# Patient Record
Sex: Female | Born: 1955 | Race: White | Hispanic: No | Marital: Married | Smoking: Never smoker
Health system: Southern US, Community
[De-identification: ages and names within clinical notes are randomized; demographics above are authoritative.]

## PROBLEM LIST (undated history)

## (undated) DIAGNOSIS — K219 Gastro-esophageal reflux disease without esophagitis: Secondary | ICD-10-CM

## (undated) DIAGNOSIS — G709 Myoneural disorder, unspecified: Secondary | ICD-10-CM

## (undated) DIAGNOSIS — T7840XA Allergy, unspecified, initial encounter: Secondary | ICD-10-CM

## (undated) DIAGNOSIS — J45909 Unspecified asthma, uncomplicated: Secondary | ICD-10-CM

## (undated) DIAGNOSIS — M1990 Unspecified osteoarthritis, unspecified site: Secondary | ICD-10-CM

## (undated) DIAGNOSIS — E785 Hyperlipidemia, unspecified: Secondary | ICD-10-CM

## (undated) HISTORY — DX: Allergy, unspecified, initial encounter: T78.40XA

## (undated) HISTORY — DX: Unspecified osteoarthritis, unspecified site: M19.90

## (undated) HISTORY — DX: Myoneural disorder, unspecified: G70.9

## (undated) HISTORY — PX: CHOLECYSTECTOMY: SHX55

## (undated) HISTORY — PX: ABDOMINAL HYSTERECTOMY: SHX81

## (undated) HISTORY — PX: TONSILLECTOMY: SUR1361

## (undated) HISTORY — DX: Hyperlipidemia, unspecified: E78.5

## (undated) HISTORY — DX: Unspecified asthma, uncomplicated: J45.909

## (undated) HISTORY — PX: WRIST SURGERY: SHX841

## (undated) HISTORY — DX: Gastro-esophageal reflux disease without esophagitis: K21.9

---

## 2010-05-16 ENCOUNTER — Emergency Department (HOSPITAL_COMMUNITY)
Admission: EM | Admit: 2010-05-16 | Discharge: 2010-05-16 | Disposition: A | Discharge status: Home / Self Care | Payer: PRIVATE HEALTH INSURANCE | Attending: Emergency Medicine | Admitting: Emergency Medicine

## 2010-05-16 DIAGNOSIS — J45909 Unspecified asthma, uncomplicated: Secondary | ICD-10-CM | POA: Insufficient documentation

## 2010-05-16 DIAGNOSIS — M79609 Pain in unspecified limb: Secondary | ICD-10-CM | POA: Insufficient documentation

## 2010-05-16 DIAGNOSIS — Z79899 Other long term (current) drug therapy: Secondary | ICD-10-CM | POA: Insufficient documentation

## 2010-05-16 DIAGNOSIS — M7989 Other specified soft tissue disorders: Secondary | ICD-10-CM | POA: Insufficient documentation

## 2010-06-02 ENCOUNTER — Other Ambulatory Visit: Payer: Self-pay | Admitting: Family Medicine

## 2010-06-02 DIAGNOSIS — Z1231 Encounter for screening mammogram for malignant neoplasm of breast: Secondary | ICD-10-CM

## 2010-06-10 ENCOUNTER — Ambulatory Visit
Admission: RE | Admit: 2010-06-10 | Discharge: 2010-06-10 | Disposition: A | Discharge status: Home / Self Care | Payer: PRIVATE HEALTH INSURANCE | Source: Ambulatory Visit | Attending: Family Medicine | Admitting: Family Medicine

## 2010-06-10 DIAGNOSIS — Z1231 Encounter for screening mammogram for malignant neoplasm of breast: Secondary | ICD-10-CM

## 2011-05-17 ENCOUNTER — Other Ambulatory Visit: Payer: Self-pay | Admitting: Family Medicine

## 2011-05-17 ENCOUNTER — Other Ambulatory Visit: Payer: Self-pay | Admitting: Internal Medicine

## 2011-05-17 DIAGNOSIS — Z1231 Encounter for screening mammogram for malignant neoplasm of breast: Secondary | ICD-10-CM

## 2011-06-14 ENCOUNTER — Inpatient Hospital Stay: Admission: RE | Admit: 2011-06-14 | Payer: PRIVATE HEALTH INSURANCE | Source: Ambulatory Visit

## 2011-07-05 ENCOUNTER — Ambulatory Visit: Payer: PRIVATE HEALTH INSURANCE

## 2011-07-12 ENCOUNTER — Ambulatory Visit: Payer: PRIVATE HEALTH INSURANCE

## 2011-07-19 ENCOUNTER — Ambulatory Visit
Admission: RE | Admit: 2011-07-19 | Discharge: 2011-07-19 | Disposition: A | Discharge status: Home / Self Care | Payer: PRIVATE HEALTH INSURANCE | Source: Ambulatory Visit | Attending: Family Medicine | Admitting: Family Medicine

## 2012-10-16 ENCOUNTER — Other Ambulatory Visit: Payer: Self-pay

## 2012-10-16 DIAGNOSIS — Z1231 Encounter for screening mammogram for malignant neoplasm of breast: Secondary | ICD-10-CM

## 2012-11-02 ENCOUNTER — Ambulatory Visit
Admission: RE | Admit: 2012-11-02 | Discharge: 2012-11-02 | Disposition: A | Discharge status: Home / Self Care | Payer: BC Managed Care – PPO | Source: Ambulatory Visit

## 2012-11-02 DIAGNOSIS — Z1231 Encounter for screening mammogram for malignant neoplasm of breast: Secondary | ICD-10-CM

## 2013-11-13 ENCOUNTER — Other Ambulatory Visit: Payer: Self-pay | Admitting: Obstetrics & Gynecology

## 2013-11-13 DIAGNOSIS — Z1231 Encounter for screening mammogram for malignant neoplasm of breast: Secondary | ICD-10-CM

## 2013-11-21 ENCOUNTER — Other Ambulatory Visit: Payer: Self-pay | Admitting: Obstetrics & Gynecology

## 2013-11-21 DIAGNOSIS — Z1231 Encounter for screening mammogram for malignant neoplasm of breast: Secondary | ICD-10-CM

## 2013-11-29 ENCOUNTER — Ambulatory Visit
Admission: RE | Admit: 2013-11-29 | Discharge: 2013-11-29 | Disposition: A | Discharge status: Home / Self Care | Payer: BC Managed Care – PPO | Source: Ambulatory Visit | Attending: Obstetrics & Gynecology | Admitting: Obstetrics & Gynecology

## 2013-11-29 DIAGNOSIS — Z1231 Encounter for screening mammogram for malignant neoplasm of breast: Secondary | ICD-10-CM

## 2014-07-10 ENCOUNTER — Ambulatory Visit (INDEPENDENT_AMBULATORY_CARE_PROVIDER_SITE_OTHER): Payer: 59 | Admitting: Physical Therapy

## 2014-07-10 ENCOUNTER — Encounter: Payer: Self-pay | Admitting: Physical Therapy

## 2014-07-10 DIAGNOSIS — M25673 Stiffness of unspecified ankle, not elsewhere classified: Secondary | ICD-10-CM

## 2014-07-10 DIAGNOSIS — R531 Weakness: Secondary | ICD-10-CM | POA: Diagnosis not present

## 2014-07-10 DIAGNOSIS — M25571 Pain in right ankle and joints of right foot: Secondary | ICD-10-CM

## 2014-07-10 DIAGNOSIS — R269 Unspecified abnormalities of gait and mobility: Secondary | ICD-10-CM | POA: Diagnosis not present

## 2014-07-10 NOTE — Therapy (Addendum)
[place], [place], [address] Phone: [phone]   Fax:  [phone]  Physical Therapy Evaluation  Patient Details  Name: Betty Fuller MRN: [id number] Date of Birth: [birth year] Referring Provider:  Wylene Simmer, MD  Encounter Date: 07/10/2014      PT End of Session - 07/17/14 0936    Visit Number 2   Number of Visits 8   Date for PT Re-Evaluation 08/07/14   PT Start Time 0932   PT Stop Time 9191   PT Time Calculation (min) 55 min      Past Medical History  Diagnosis Date  . Allergy   . Arthritis   . Asthma   . GERD (gastroesophageal reflux disease)   . Hyperlipidemia   . Neuromuscular disorder     Past Surgical History  Procedure Laterality Date  . Wrist surgery Right     1998  . Cholecystectomy      2010  . Cesarean section    . Tonsillectomy    . Abdominal hysterectomy      There were no vitals filed for this visit.  Visit Diagnosis:  Stiffness of ankle joint, unspecified laterality - Plan: PT plan of care cert/re-cert  Pain in joint, ankle and foot, right - Plan: PT plan of care cert/re-cert  Weakness generalized - Plan: PT plan of care cert/re-cert  Abnormality of gait - Plan: PT plan of care cert/re-cert      Subjective Assessment - 07/17/14 0936    Subjective Pt reports her Rt ankle is still very point tender to touch.  Pt has been performing HEP.   At night, pain is worst.    Currently in Pain? Yes   Pain Score 7   worse at night    Pain Location Ankle   Pain Orientation Right   Pain Descriptors / Indicators Aching;Dull   Aggravating Factors  standing and walking, touching area   Pain Relieving Factors medication and sleep            [facility] PT Assessment - 07/17/14 0001    Assessment   Medical Diagnosis Rt peroneal tendon strain   Onset Date/Surgical Date 06/09/14   Next MD Visit 08/22/14   AROM   Right Ankle Dorsiflexion 14   Right Ankle Plantar  Flexion 42   Strength   Right Ankle Dorsiflexion 5/5   Right Ankle Inversion 5/5   Right Ankle Eversion --  5-/5, with pain                    [facility] Adult PT Treatment/Exercise - 07/17/14 0001    Exercises   Exercises Ankle   Modalities   Modalities Iontophoresis;Vasopneumatic   Iontophoresis   Type of Iontophoresis Dexamethasone   Location Rt lateral 5th met head   Dose 1.0cc   Time 6hr patch   Vasopneumatic   Number Minutes Vasopneumatic  15 minutes   Vasopnuematic Location  Ankle   Vasopneumatic Pressure Medium   Vasopneumatic Temperature  3*   Ankle Exercises: Stretches   Soleus Stretch 30 seconds;3 reps   Gastroc Stretch 30 seconds;3 reps   Ankle Exercises: Aerobic   Stationary Bike NuStep L4: 38mn    Ankle Exercises: Seated   Towel Crunch 3 reps   Towel Inversion/Eversion 3 reps   Heel Raises 5 reps  2 sets, painful    Toe Raise 10 reps   Ankle Exercises: Supine   T-Band Inversion, eversion, DF, PF x 10 reps x  2 sets with red band                 PT Education - 07/17/14 1029    Education provided Yes   Education Details HEP   Person(s) Educated Patient   Methods Explanation;Handout   Comprehension Verbalized understanding             PT Long Term Goals - 07/10/14 1104    PT LONG TERM GOAL #1   Title I with advanced HEP ( 08/07/14)   Time 4   Period Weeks   Status New   PT LONG TERM GOAL #2   Title increase bilat ankle dorsiflexion =/> 15 degrees to assist with normalizing gait ( 08/07/14)    Time 4   Period Weeks   Status New   PT LONG TERM GOAL #4   Title increase strenght bilat LE's =/> 5-/5 ( 08/07/14)   Time 4   Period Weeks   Status New   PT LONG TERM GOAL #5   Title report decrease pain in Rt ankle =/> 75% with daily activity ( 08/07/14)    Time 4   Period Weeks   Status New   Additional Long Term Goals   Additional Long Term Goals Yes   PT LONG TERM GOAL #6   Title improve FOTO =/< 36% limited   Time 4   Period  Weeks   Status New               Plan - 07/17/14 1001    Clinical Impression Statement Pt demo improved Rt ankle ROM and strength today.  Pt continues with increased pain and sensitivity in Rt lateral foot. Pt tolerated all exercises except Bilat heel raises due to pain.    Pt will benefit from skilled therapeutic intervention in order to improve on the following deficits Abnormal gait;Decreased range of motion;Difficulty walking;Pain;Decreased strength;Decreased balance   Rehab Potential Excellent   PT Frequency 2x / week   PT Duration 4 weeks   PT Treatment/Interventions Cryotherapy;Electrical Stimulation;Iontophoresis 8m/ml Dexamethasone;Gait training;Ultrasound;Moist Heat;Therapeutic exercise;Balance training;Neuromuscular re-education;Patient/family education   PT Next Visit Plan progress ankle and hip strengthening, continue ionto   Consulted and Agree with Plan of Care Patient         Problem List There are no active problems to display for this patient.   SJeral PinchPT 07/17/2014, 2:27 PM  [facility] [address]Phone: [phone]  Fax:  [phone]

## 2014-07-10 NOTE — Patient Instructions (Signed)
Ankle Alphabet  [place] [id number]   Using left ankle and foot only, trace the letters of the alphabet. Perform A to Z. Repeat _1___ times per set. Do _1___ sets per session. Do __2-3__ sessions per day   Ankle Circles   Slowly rotate right foot and ankle clockwise then counterclockwise. Gradually increase range of motion. Avoid pain. Circle __10__ times each direction per set. Do __1__ sets per session. Do 2-3____ sessions per day.  http://orth.exer.us/30   ROM: Plantar / Dorsiflexion   With left leg relaxed, gently flex and extend ankle. Move through full range of motion. Avoid pain. Repeat __20__ times per set. Do __1__ sets per session. Do __2-3__ sessions per day.  Achilles / Gastroc, Standing   Stand, right foot behind, heel on floor and turned slightly out, leg straight, forward leg bent. Move hips forward. Hold 30-45___ seconds. Repeat _1__ times per session. Do _2-3__ sessions per day.  Copyright  VHI. All rights reserved.   Start icing the side of your ankle for 10-15 min, twice a day.

## 2014-07-17 ENCOUNTER — Ambulatory Visit (INDEPENDENT_AMBULATORY_CARE_PROVIDER_SITE_OTHER): Payer: 59 | Admitting: Physical Therapy

## 2014-07-17 DIAGNOSIS — R269 Unspecified abnormalities of gait and mobility: Secondary | ICD-10-CM | POA: Diagnosis not present

## 2014-07-17 DIAGNOSIS — R531 Weakness: Secondary | ICD-10-CM

## 2014-07-17 DIAGNOSIS — M25673 Stiffness of unspecified ankle, not elsewhere classified: Secondary | ICD-10-CM

## 2014-07-17 DIAGNOSIS — M25571 Pain in right ankle and joints of right foot: Secondary | ICD-10-CM

## 2014-07-17 NOTE — Therapy (Addendum)
[place], [place], [address] Phone: [phone]   Fax:  [phone]  Physical Therapy Treatment  Patient Details  Name: Betty Fuller MRN: [id number] Date of Birth: 1955/11/03 Referring Provider:  Wylene Simmer, MD  Encounter Date: 07/17/2014      PT End of Session - 07/17/14 0936    Visit Number 2   Number of Visits 8   Date for PT Re-Evaluation 08/07/14   PT Start Time 0932   PT Stop Time 8099   PT Time Calculation (min) 55 min      Past Medical History  Diagnosis Date  . Allergy   . Arthritis   . Asthma   . GERD (gastroesophageal reflux disease)   . Hyperlipidemia   . Neuromuscular disorder     Past Surgical History  Procedure Laterality Date  . Wrist surgery Right     1998  . Cholecystectomy      2010  . Cesarean section    . Tonsillectomy    . Abdominal hysterectomy      There were no vitals filed for this visit.  Visit Diagnosis:  Stiffness of ankle joint, unspecified laterality  Pain in joint, ankle and foot, right  Weakness generalized  Abnormality of gait      Subjective Assessment - 07/17/14 0936    Subjective Pt reports her Rt ankle is still very point tender to touch.  Pt has been performing HEP.   At night, pain is worst.    Currently in Pain? Yes   Pain Score 7   worse at night    Pain Location Ankle   Pain Orientation Right   Pain Descriptors / Indicators Aching;Dull   Aggravating Factors  standing and walking, touching area   Pain Relieving Factors medication and sleep            [facility] PT Assessment - 07/17/14 0001    Assessment   Medical Diagnosis Rt peroneal tendon strain   Onset Date/Surgical Date 06/09/14   Next MD Visit 08/22/14   AROM   Right Ankle Dorsiflexion 14   Right Ankle Plantar Flexion 42   Strength   Right Ankle Dorsiflexion 5/5   Right Ankle Inversion 5/5   Right Ankle Eversion --  5-/5, with pain                       [facility] Adult PT Treatment/Exercise - 07/17/14 0001    Exercises   Exercises Ankle   Modalities   Modalities Iontophoresis;Vasopneumatic   Iontophoresis   Type of Iontophoresis Dexamethasone   Location Rt lateral 5th met head   Dose 1.0cc   Time 6hr patch   Vasopneumatic   Number Minutes Vasopneumatic  15 minutes   Vasopnuematic Location  Ankle   Vasopneumatic Pressure Medium   Vasopneumatic Temperature  3*   Ankle Exercises: Stretches   Soleus Stretch 30 seconds;3 reps   Gastroc Stretch 30 seconds;3 reps   Ankle Exercises: Aerobic   Stationary Bike NuStep L4: 69mn    Ankle Exercises: Seated   Towel Crunch 3 reps   Towel Inversion/Eversion 3 reps   Heel Raises 5 reps  2 sets, painful    Toe Raise 10 reps   Ankle Exercises: Supine   T-Band Inversion, eversion, DF, PF x 10 reps x 2 sets with red band                 PT Education - 07/17/14 1029  Education provided Yes   Education Details HEP   Person(s) Educated Patient   Methods Explanation;Handout   Comprehension Verbalized understanding             PT Long Term Goals - 07/10/14 1104    PT LONG TERM GOAL #1   Title I with advanced HEP ( 08/07/14)   Time 4   Period Weeks   Status New   PT LONG TERM GOAL #2   Title increase bilat ankle dorsiflexion =/> 15 degrees to assist with normalizing gait ( 08/07/14)    Time 4   Period Weeks   Status New   PT LONG TERM GOAL #4   Title increase strenght bilat LE's =/> 5-/5 ( 08/07/14)   Time 4   Period Weeks   Status New   PT LONG TERM GOAL #5   Title report decrease pain in Rt ankle =/> 75% with daily activity ( 08/07/14)    Time 4   Period Weeks   Status New   Additional Long Term Goals   Additional Long Term Goals Yes   PT LONG TERM GOAL #6   Title improve FOTO =/< 36% limited   Time 4   Period Weeks   Status New               Plan - 07/17/14 1001    Clinical Impression Statement Pt demo improved Rt ankle ROM  and strength today.  Pt continues with increased pain and sensitivity in Rt lateral foot. Pt tolerated all exercises except Bilat heel raises due to pain.    Pt will benefit from skilled therapeutic intervention in order to improve on the following deficits Abnormal gait;Decreased range of motion;Difficulty walking;Pain;Decreased strength;Decreased balance   Rehab Potential Excellent   PT Frequency 2x / week   PT Duration 4 weeks   PT Treatment/Interventions Cryotherapy;Electrical Stimulation;Iontophoresis 72m/ml Dexamethasone;Gait training;Ultrasound;Moist Heat;Therapeutic exercise;Balance training;Neuromuscular re-education;Patient/family education   PT Next Visit Plan progress ankle and hip strengthening, continue ionto   Consulted and Agree with Plan of Care Patient        Problem List There are no active problems to display for this patient.   JKerin Perna PTA 07/17/2014 10:44 AM  [facility] [address]Phone: [phone]  Fax:  [phone]

## 2014-07-17 NOTE — Patient Instructions (Addendum)
Inversion: Resisted   Cross legs with right leg underneath, foot in tubing loop. Hold tubing around other foot to resist and turn foot in. Repeat __10__ times per set. Do _2-3___ sets per session. Do _1___ sessions per day.   Eversion: Resisted   With right foot in tubing loop, hold tubing around other foot to resist and turn foot out. Repeat _10___ times per set. Do __2-3__ sets per session. Do __1__ sessions per day.     Dorsiflexion: Resisted   Facing anchor, tubing around left foot, pull toward face.  Repeat _10___ times per set. Do __2-3__ sets per session. Do _1___ sessions per day.   [facility] [address] [place], [place] [address]  [phone] (office) [phone] (fax)

## 2014-07-22 ENCOUNTER — Encounter: Payer: Self-pay | Admitting: Physical Therapy

## 2014-07-24 ENCOUNTER — Ambulatory Visit (INDEPENDENT_AMBULATORY_CARE_PROVIDER_SITE_OTHER): Payer: 59 | Admitting: Physical Therapy

## 2014-07-24 DIAGNOSIS — M25673 Stiffness of unspecified ankle, not elsewhere classified: Secondary | ICD-10-CM | POA: Diagnosis not present

## 2014-07-24 DIAGNOSIS — M25571 Pain in right ankle and joints of right foot: Secondary | ICD-10-CM | POA: Diagnosis not present

## 2014-07-24 DIAGNOSIS — R269 Unspecified abnormalities of gait and mobility: Secondary | ICD-10-CM | POA: Diagnosis not present

## 2014-07-24 DIAGNOSIS — R531 Weakness: Secondary | ICD-10-CM

## 2014-07-24 NOTE — Therapy (Addendum)
[facility], [place], [address] Phone: [phone]   Fax:  [phone]  Physical Therapy Treatment  Patient Details  Name: Betty Fuller MRN: [id number] Date of Birth: [birth year] Referring Provider:  Wylene Simmer, MD  Encounter Date: 07/24/2014      PT End of Session - 07/24/14 0942    Visit Number 3   Number of Visits 8   Date for PT Re-Evaluation 08/07/14   PT Start Time 0940   PT Stop Time 1026   PT Time Calculation (min) 46 min      Past Medical History  Diagnosis Date  . Allergy   . Arthritis   . Asthma   . GERD (gastroesophageal reflux disease)   . Hyperlipidemia   . Neuromuscular disorder     Past Surgical History  Procedure Laterality Date  . Wrist surgery Right     1998  . Cholecystectomy      2010  . Cesarean section    . Tonsillectomy    . Abdominal hysterectomy      There were no vitals filed for this visit.  Visit Diagnosis:  Stiffness of ankle joint, unspecified laterality  Pain in joint, ankle and foot, right  Weakness generalized  Abnormality of gait      Subjective Assessment - 07/24/14 0942    Subjective Pt reports the pain in Rt lateral foot has not changed any.  Reports pain is still worst at night.    Currently in Pain? Yes   Pain Score 7    Pain Location Foot   Pain Orientation Right   Pain Descriptors / Indicators Sharp  Nagging    Aggravating Factors  Standing, walking, touching area   Pain Relieving Factors medication and rest             [facility] PT Assessment - 07/24/14 0001    Assessment   Medical Diagnosis Rt peroneal tendon strain   Onset Date/Surgical Date 06/09/14   Next MD Visit 08/22/14                     [facility] Adult PT Treatment/Exercise - 07/24/14 0001    Modalities   Modalities Iontophoresis;Ultrasound   Ultrasound   Ultrasound Location Rt lateral ankle and 5th metatarsal head   Ultrasound Parameters 50%,  1.2 w/cm2, 3.3 mHz, 8 min    Ultrasound Goals Edema;Pain   Iontophoresis   Type of Iontophoresis Dexamethasone   Location Rt lateral 5th met head   Dose 1.0cc   Time 6hr patch   Manual Therapy   Manual Therapy Other (comment)   Other Manual Therapy Ice massage to Rt 5th met head x 3 min    Ankle Exercises: Seated   Towel Crunch 3 reps;Weights  2# on bath towel   Towel Inversion/Eversion 3 reps  2# wt on towel.   Other Seated Ankle Exercises Ankle eversion with red band x 10 reps x 3 sets    Ankle Exercises: Aerobic   Stationary Bike NuStep L4: 82mn    Ankle Exercises: Stretches   Soleus Stretch 30 seconds;3 reps   Gastroc Stretch 30 seconds;3 reps   Ankle Exercises: Standing   SLS 3 trials of up to 30 sec,  SLS on each leg    Heel Raises 5 reps  2 sets, BUE support   Toe Raise 10 reps                PT Education - 07/24/14 1328  Education provided Yes   Education Details self care- pt instructed to try ice massage at home.    Person(s) Educated Patient   Methods Handout   Comprehension Verbalized understanding;Returned demonstration             PT Long Term Goals - 07/10/14 1104    PT LONG TERM GOAL #1   Title I with advanced HEP ( 08/07/14)   Time 4   Period Weeks   Status New   PT LONG TERM GOAL #2   Title increase bilat ankle dorsiflexion =/> 15 degrees to assist with normalizing gait ( 08/07/14)    Time 4   Period Weeks   Status New   PT LONG TERM GOAL #4   Title increase strenght bilat LE's =/> 5-/5 ( 08/07/14)   Time 4   Period Weeks   Status New   PT LONG TERM GOAL #5   Title report decrease pain in Rt ankle =/> 75% with daily activity ( 08/07/14)    Time 4   Period Weeks   Status New   Additional Long Term Goals   Additional Long Term Goals Yes   PT LONG TERM GOAL #6   Title improve FOTO =/< 36% limited   Time 4   Period Weeks   Status New               Plan - 07/24/14 1407    Clinical Impression Statement Pt able to tolerate  ther ex a little better this session. Pt's pain remains unchanged from last session; area in Rt lateral foot continues to experience increased pain with SLS, B heel raises, and heel cord stretches on step.  Pt noted decrease in pain at end of session with use of [place] and ice massage.  Pt concerned with high co-pay; requests to continue therapy at 1x/wk instead of 2x.     Pt will benefit from skilled therapeutic intervention in order to improve on the following deficits Abnormal gait;Decreased range of motion;Difficulty walking;Pain;Decreased strength;Decreased balance   Rehab Potential Excellent   PT Frequency 2x / week   PT Duration 4 weeks   PT Treatment/Interventions Cryotherapy;Electrical Stimulation;Iontophoresis 41m/ml Dexamethasone;Gait training;Ultrasound;Moist Heat;Therapeutic exercise;Balance training;Neuromuscular re-education;Patient/family education   PT Next Visit Plan progress ankle and hip strengthening, continue ionto and [place]    Consulted and Agree with Plan of Care Patient        Problem List There are no active problems to display for this patient.   JKerin Perna PTA 07/24/2014 2:10 PM  [facility] [place] [address] [address]Phone: [phone]  Fax:  [phone]    PHYSICAL THERAPY DISCHARGE SUMMARY  Visits from Start of Care: 3  Current functional level related to goals / functional outcomes: See above   Remaining deficits: See above   Education / Equipment: HEP Plan: Patient agrees to discharge.  Patient goals were not met. Patient is being discharged due to financial reasons.  ?????   SJeral Pinch PT 08/13/2014 8:15 AM

## 2014-07-30 ENCOUNTER — Encounter: Payer: 59 | Admitting: Physical Therapy

## 2014-10-28 ENCOUNTER — Other Ambulatory Visit: Payer: Self-pay

## 2014-10-28 DIAGNOSIS — Z1231 Encounter for screening mammogram for malignant neoplasm of breast: Secondary | ICD-10-CM

## 2014-12-02 ENCOUNTER — Ambulatory Visit: Payer: 59

## 2015-01-02 ENCOUNTER — Ambulatory Visit
Admission: RE | Admit: 2015-01-02 | Discharge: 2015-01-02 | Disposition: A | Discharge status: Home / Self Care | Payer: Managed Care, Other (non HMO) | Source: Ambulatory Visit

## 2015-01-02 DIAGNOSIS — Z1231 Encounter for screening mammogram for malignant neoplasm of breast: Secondary | ICD-10-CM

## 2015-10-15 ENCOUNTER — Other Ambulatory Visit: Payer: Self-pay | Admitting: Obstetrics & Gynecology

## 2015-10-15 DIAGNOSIS — Z1231 Encounter for screening mammogram for malignant neoplasm of breast: Secondary | ICD-10-CM

## 2015-10-29 ENCOUNTER — Other Ambulatory Visit: Payer: Self-pay | Admitting: Family Medicine

## 2015-10-29 DIAGNOSIS — Z1231 Encounter for screening mammogram for malignant neoplasm of breast: Secondary | ICD-10-CM

## 2016-01-04 ENCOUNTER — Encounter: Payer: Self-pay | Admitting: Radiology

## 2016-01-04 ENCOUNTER — Ambulatory Visit
Admission: RE | Admit: 2016-01-04 | Discharge: 2016-01-04 | Disposition: A | Discharge status: Home / Self Care | Payer: 59 | Source: Ambulatory Visit | Attending: Family Medicine | Admitting: Family Medicine

## 2016-01-04 DIAGNOSIS — Z1231 Encounter for screening mammogram for malignant neoplasm of breast: Secondary | ICD-10-CM

## 2016-12-05 ENCOUNTER — Other Ambulatory Visit: Payer: Self-pay | Admitting: Family Medicine

## 2016-12-05 DIAGNOSIS — Z1231 Encounter for screening mammogram for malignant neoplasm of breast: Secondary | ICD-10-CM

## 2017-01-06 ENCOUNTER — Ambulatory Visit
Admission: RE | Admit: 2017-01-06 | Discharge: 2017-01-06 | Disposition: A | Discharge status: Home / Self Care | Payer: 59 | Source: Ambulatory Visit | Attending: Family Medicine | Admitting: Family Medicine

## 2017-01-06 DIAGNOSIS — Z1231 Encounter for screening mammogram for malignant neoplasm of breast: Secondary | ICD-10-CM

## 2017-12-14 ENCOUNTER — Other Ambulatory Visit: Payer: Self-pay | Admitting: Family Medicine

## 2017-12-14 DIAGNOSIS — Z1231 Encounter for screening mammogram for malignant neoplasm of breast: Secondary | ICD-10-CM

## 2018-01-26 ENCOUNTER — Ambulatory Visit: Payer: 59

## 2018-02-27 ENCOUNTER — Ambulatory Visit: Payer: Self-pay

## 2018-03-08 ENCOUNTER — Emergency Department (INDEPENDENT_AMBULATORY_CARE_PROVIDER_SITE_OTHER)
Admission: EM | Admit: 2018-03-08 | Discharge: 2018-03-08 | Disposition: A | Discharge status: Home / Self Care | Payer: Commercial Managed Care - PPO | Source: Home / Self Care | Attending: Family Medicine | Admitting: Family Medicine

## 2018-03-08 ENCOUNTER — Encounter: Payer: Self-pay | Admitting: Emergency Medicine

## 2018-03-08 ENCOUNTER — Other Ambulatory Visit: Payer: Self-pay

## 2018-03-08 ENCOUNTER — Emergency Department (INDEPENDENT_AMBULATORY_CARE_PROVIDER_SITE_OTHER): Payer: Commercial Managed Care - PPO

## 2018-03-08 DIAGNOSIS — R197 Diarrhea, unspecified: Secondary | ICD-10-CM

## 2018-03-08 DIAGNOSIS — G44209 Tension-type headache, unspecified, not intractable: Secondary | ICD-10-CM

## 2018-03-08 DIAGNOSIS — R69 Illness, unspecified: Secondary | ICD-10-CM

## 2018-03-08 DIAGNOSIS — R0789 Other chest pain: Secondary | ICD-10-CM

## 2018-03-08 DIAGNOSIS — R110 Nausea: Secondary | ICD-10-CM | POA: Diagnosis not present

## 2018-03-08 DIAGNOSIS — R05 Cough: Secondary | ICD-10-CM

## 2018-03-08 DIAGNOSIS — R079 Chest pain, unspecified: Secondary | ICD-10-CM

## 2018-03-08 DIAGNOSIS — J111 Influenza due to unidentified influenza virus with other respiratory manifestations: Secondary | ICD-10-CM

## 2018-03-08 MED ORDER — OSELTAMIVIR PHOSPHATE 75 MG PO CAPS: 75.0000 mg | ORAL_CAPSULE | Freq: Two times a day (BID) | ORAL | 0 refills | Status: AC

## 2018-03-08 MED ORDER — ACETAMINOPHEN 325 MG PO TABS
650.0000 mg | ORAL_TABLET | Freq: Once | ORAL | Status: AC
  Administered 2018-03-08: 975 mg via ORAL

## 2018-03-08 MED ORDER — ONDANSETRON 4 MG PO TBDP
4.0000 mg | ORAL_TABLET | Freq: Once | ORAL | Status: AC
  Administered 2018-03-08: 4 mg via ORAL

## 2018-03-08 MED ORDER — SODIUM CHLORIDE 0.9 % IV SOLN
Freq: Once | INTRAVENOUS | Status: AC
  Administered 2018-03-08: 20:00:00 via INTRAVENOUS

## 2018-03-08 MED ORDER — KETOROLAC TROMETHAMINE 30 MG/ML IJ SOLN
30.0000 mg | Freq: Once | INTRAMUSCULAR | Status: AC
  Administered 2018-03-08: 30 mg via INTRAMUSCULAR

## 2018-03-08 NOTE — Discharge Instructions (Addendum)
Begin clear liquids (Pedialyte while having diarrhea) until improved, then advance to a [employer] (Bananas, Rice, Applesauce, Toast).  Then gradually resume a regular diet when tolerated.  Avoid milk products until well.    May take acetaminophen 500mg  for headache. Continue all asthma medications as prescribed. If symptoms become significantly worse during the night or over the weekend, proceed to the local emergency room.

## 2018-03-08 NOTE — ED Notes (Signed)
IV disconnected, clean and dry.  Catheter intact.  No swelling or bruising noted.

## 2018-03-08 NOTE — ED Provider Notes (Signed)
[facility]    CSN: [id number] Arrival date & time: 03/08/18  1706     History   Chief Complaint Chief Complaint  Patient presents with  . Nausea    HPI Betty Fuller is a 63 y.o. female.   Patient began to feel ill yesterday evening with fatigue, sore throat, nausea, headache, and diarrhea.  She denies vomiting.  She continued to feel ill this morning with myalgias, cough, and persistent diarrhea.  She has asthma, complaining of tightness in her anterior chest but no significant shortness of breath.  She has had a seasonal flu immunization last September.  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  The history is provided by the patient and the spouse.  Diarrhea  Quality:  Watery Severity:  Moderate Onset quality:  Sudden Number of episodes:  6 Duration:  1 day Timing:  Sporadic Progression:  Improving Relieved by:  Nothing Worsened by:  Nothing Ineffective treatments:  None tried Associated symptoms: arthralgias, chills, cough, diaphoresis, fever, headaches and myalgias   Associated symptoms: no abdominal pain and no vomiting     Past Medical History:  Diagnosis Date  . Allergy   . Arthritis   . Asthma   . GERD (gastroesophageal reflux disease)   . Hyperlipidemia   . Neuromuscular disorder (HCC)     Active problems:  Asthma, Cervical dystonia   Past Surgical History:  Procedure Laterality Date  . ABDOMINAL HYSTERECTOMY    . CESAREAN SECTION    . CHOLECYSTECTOMY     2010  . TONSILLECTOMY    . WRIST SURGERY Right    1998       Home Medications    Prior to Admission medications   Medication Sig Start Date End Date Taking? Authorizing Provider  baclofen (LIORESAL) 10 MG tablet Take 10 mg by mouth 3 (three) times daily.    [provider]  cetirizine (ZYRTEC) 10 MG tablet Take 10 mg by mouth daily.    [provider]  fluticasone (FLONASE) 50 MCG/ACT nasal  spray Place into both nostrils daily.    [provider]  Fluticasone-Salmeterol (ADVAIR) 500-50 MCG/DOSE AEPB Inhale 1 puff into the lungs 2 (two) times daily.    [provider]  ibuprofen (ADVIL,MOTRIN) 200 MG tablet Take 200 mg by mouth every 6 (six) hours as needed.    [provider]  oseltamivir (TAMIFLU) 75 MG capsule Take 1 capsule (75 mg total) by mouth every 12 (twelve) hours. 03/08/18   Lattie HawBeese, Alfredia Desanctis A, MD  primidone (MYSOLINE) 50 MG tablet Take by mouth 4 (four) times daily.    [provider]    Family History Family History  Problem Relation Age of Onset  . Breast cancer Neg Hx     Social History Social History   Tobacco Use  . Smoking status: Never Smoker  . Smokeless tobacco: Never Used  Substance Use Topics  . Alcohol use: Not Currently  . Drug use: Not on file     Allergies   Erythromycin; Hydrocodone-acetaminophen; Indomethacin; Morphine and related; and Oxycodone-acetaminophen   Review of Systems Review of Systems  Constitutional: Positive for activity change, appetite change, chills, diaphoresis, fatigue and fever.  HENT: Positive for congestion and sore throat.   Eyes: Negative.   Respiratory: Positive for cough and chest tightness. Negative for shortness of breath, wheezing and stridor.   Cardiovascular: Positive for chest pain. Negative for palpitations.  Gastrointestinal: Positive for diarrhea and nausea.  Negative for abdominal pain and vomiting.  Genitourinary: Negative.   Musculoskeletal: Positive for arthralgias and myalgias.  Skin: Negative.   Neurological: Positive for headaches.     Physical Exam Triage Vital Signs ED Triage Vitals  Enc Vitals Group     BP 03/08/18 1738 133/84     Pulse Rate 03/08/18 1738 (!) 102     Resp --      Temp 03/08/18 1738 99 F (37.2 C)     Temp Source 03/08/18 1738 Oral     SpO2 03/08/18 1738 96 %     Weight 03/08/18 1739 178 lb (80.7 kg)     Height --      Head  Circumference --      Peak Flow --      Pain Score 03/08/18 1739 0     Pain Loc --      Pain Edu? --      Excl. in GC? --    No data found.  Updated Vital Signs BP 133/84 (BP Location: Right Arm)   Pulse (!) 102   Temp 99 F (37.2 C) (Oral)   Wt 80.7 kg   SpO2 96%   Visual Acuity Right Eye Distance:   Left Eye Distance:   Bilateral Distance:    Right Eye Near:   Left Eye Near:    Bilateral Near:     Physical Exam Nursing notes and Vital Signs reviewed. Appearance:  Patient appears stated age, and uncomfortable but in no acute distress Eyes:  Pupils are equal, round, and reactive to light and accomodation.  Extraocular movement is intact.  Conjunctivae are not inflamed.  Fundi benign.  Ears:  Canals normal.  Tympanic membranes normal.  Nose:  Congested turbinates.  No sinus tenderness.   Mouth:  Decreased moisture.  Pharynx:  Normal Neck:  Supple.  Enlarged posterior/lateral nodes are palpated bilaterally, tender to palpation on the left.   Lungs:  Clear to auscultation.  Breath sounds are equal.  Moving air well. Chest:  Distinct tenderness to palpation over the mid-sternum.  Heart:  Regular rate and rhythm without murmurs, rubs, or gallops.  Abdomen:  Nontender without masses or hepatosplenomegaly.  Bowel sounds are present.  No CVA or flank tenderness.  Extremities:  No edema.  Skin:  No rash present.   Neurologic:  Cranial nerves 2 through 12 are normal. Gait and station are normal.   UC Treatments / Results  Labs (all labs ordered are listed, but only abnormal results are displayed) Labs Reviewed - No data to display  EKG  Rate:  91 BPM PR:  118 msec QT:  336 msec QTcH:  413 msec QRSD:  76 msec QRS axis:  76 degrees Interpretation:  Normal sinus rhythm; within normal limits   Radiology Dg Chest 2 View  Result Date: 03/08/2018 CLINICAL DATA:  Productive cough.  Anterior chest pain for 1 day. EXAM: CHEST - 2 VIEW COMPARISON:  Chest radiograph March 24, 2012 FINDINGS: Cardiomediastinal silhouette is normal. No pleural effusions or focal consolidations. Mild bronchitic changes. Trachea projects midline and there is no pneumothorax. Soft tissue planes and included osseous structures are non-suspicious. Surgical clips in the included right abdomen compatible with cholecystectomy. IMPRESSION: Mild bronchitic changes without focal consolidation. Electronically Signed   By: Awilda Metro M.D.   On: 03/08/2018 18:58    Procedures Procedures (including critical care time)  Medications Ordered in UC Medications  ketorolac (TORADOL) 30 MG/ML injection 30 mg (30 mg Intramuscular Given 03/08/18 1934)  ondansetron (ZOFRAN-ODT) disintegrating tablet 4 mg (4 mg Oral Given 03/08/18 1933)  0.9 %  sodium chloride infusion ( Intravenous Stopped 03/08/18 2008)  acetaminophen (TYLENOL) tablet 650 mg (975 mg Oral Given 03/08/18 2008)    Initial Impression / Assessment and Plan / UC Course  I have reviewed the triage vital signs and the nursing notes.  Pertinent labs & imaging results that were available during my care of the patient were reviewed by me and considered in my medical decision making (see chart for details).    Normal EKG reassuring.  Chest X-ray changes probably chronic. Administered 500mL saline IV infusion Administered Zofran ODT 4mg  PO.  Administered Toradol 30mg  IM.  Administered Tylenol 650mg  PO Begin Tamiflu.  Given Rx for prophylactic Tamiflu to husband.   Final Clinical Impressions(s) / UC Diagnoses   Final diagnoses:  Influenza-like illness  Nausea without vomiting  Diarrhea, unspecified type  Acute non intractable tension-type headache     Discharge Instructions     Begin clear liquids (Pedialyte while having diarrhea) until improved, then advance to a SUPERVALU INCBRAT diet (Bananas, Rice, Applesauce, Toast).  Then gradually resume a regular diet when tolerated.  Avoid milk products until well.    May take acetaminophen 500mg  for  headache. Continue all asthma medications as prescribed. If symptoms become significantly worse during the night or over the weekend, proceed to the local emergency room.     ED Prescriptions    Medication Sig Dispense Auth. Provider   oseltamivir (TAMIFLU) 75 MG capsule Take 1 capsule (75 mg total) by mouth every 12 (twelve) hours. 10 capsule Lattie HawBeese, Ranelle Auker A, MD         Lattie HawBeese, Paulanthony Gleaves A, MD 03/09/18 1314

## 2018-03-08 NOTE — ED Triage Notes (Signed)
Pt c/o nausea, HA, Diarrhea started yesterday. Denies vomiting.

## 2018-03-30 ENCOUNTER — Ambulatory Visit
Admission: RE | Admit: 2018-03-30 | Discharge: 2018-03-30 | Disposition: A | Discharge status: Home / Self Care | Payer: Commercial Managed Care - PPO | Source: Ambulatory Visit | Attending: Family Medicine | Admitting: Family Medicine

## 2018-03-30 DIAGNOSIS — Z1231 Encounter for screening mammogram for malignant neoplasm of breast: Secondary | ICD-10-CM

## 2019-02-13 ENCOUNTER — Other Ambulatory Visit: Payer: Self-pay | Admitting: Family Medicine

## 2019-02-13 DIAGNOSIS — Z1231 Encounter for screening mammogram for malignant neoplasm of breast: Secondary | ICD-10-CM

## 2019-04-05 ENCOUNTER — Ambulatory Visit
Admission: RE | Admit: 2019-04-05 | Discharge: 2019-04-05 | Disposition: A | Discharge status: Home / Self Care | Payer: Commercial Managed Care - PPO | Source: Ambulatory Visit | Attending: Family Medicine | Admitting: Family Medicine

## 2019-04-05 ENCOUNTER — Other Ambulatory Visit: Payer: Self-pay

## 2019-04-05 DIAGNOSIS — Z1231 Encounter for screening mammogram for malignant neoplasm of breast: Secondary | ICD-10-CM

## 2019-10-13 IMAGING — MG DIGITAL SCREENING BILATERAL MAMMOGRAM WITH TOMO AND CAD
8 series · 8 of 24 positions shown · non-contrast
Comparison: Previous exam(s).

ACR Breast Density Category a: The breast tissue is almost entirely
fatty.

CLINICAL DATA: Screening.

EXAM:
DIGITAL SCREENING BILATERAL MAMMOGRAM WITH TOMO AND CAD

[L MLO synth-2D]
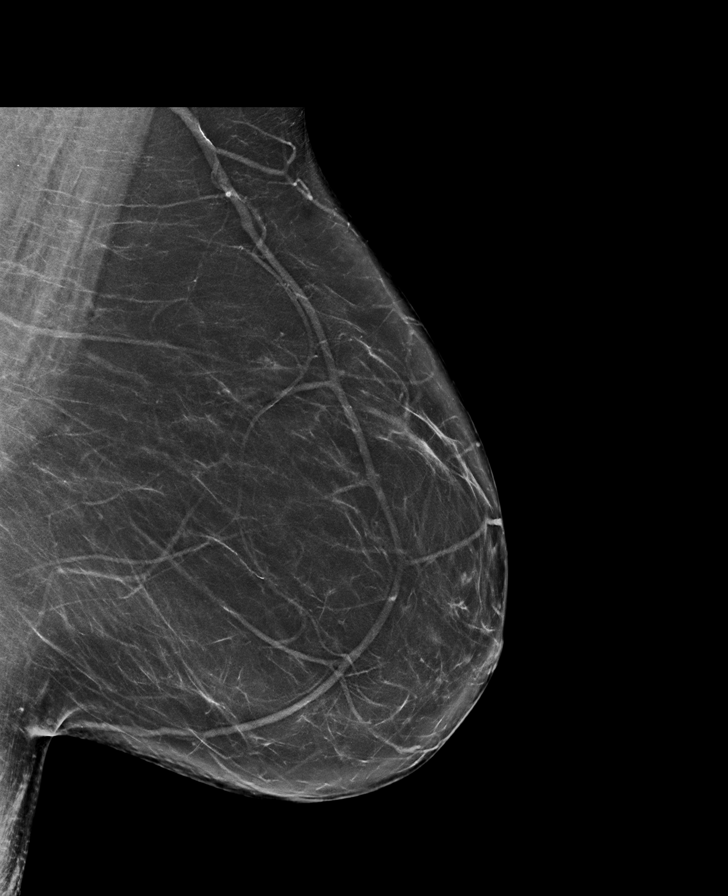

[R MLO synth-2D]
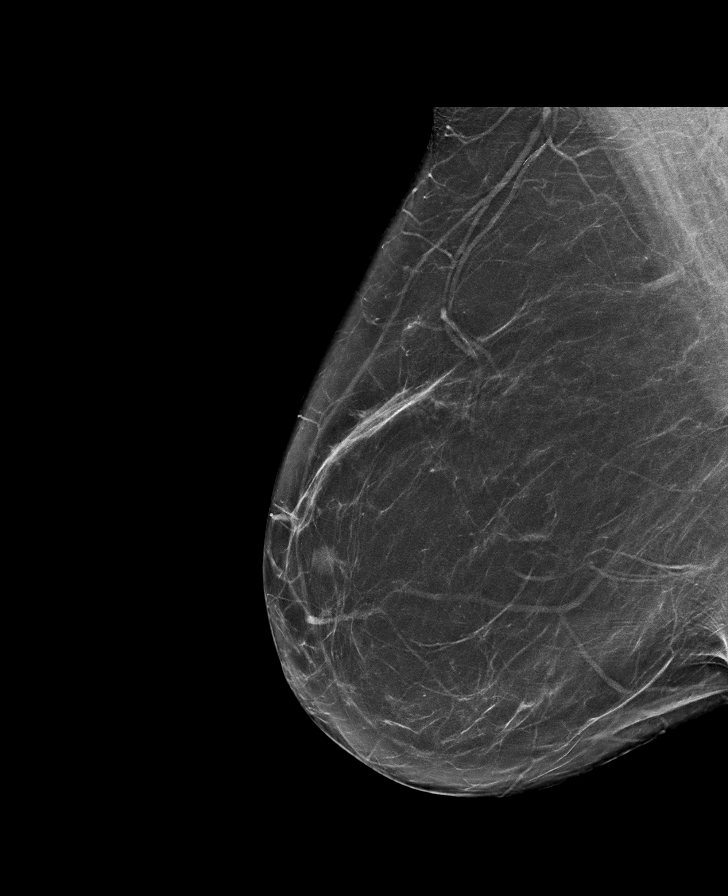

[L CC synth-2D]
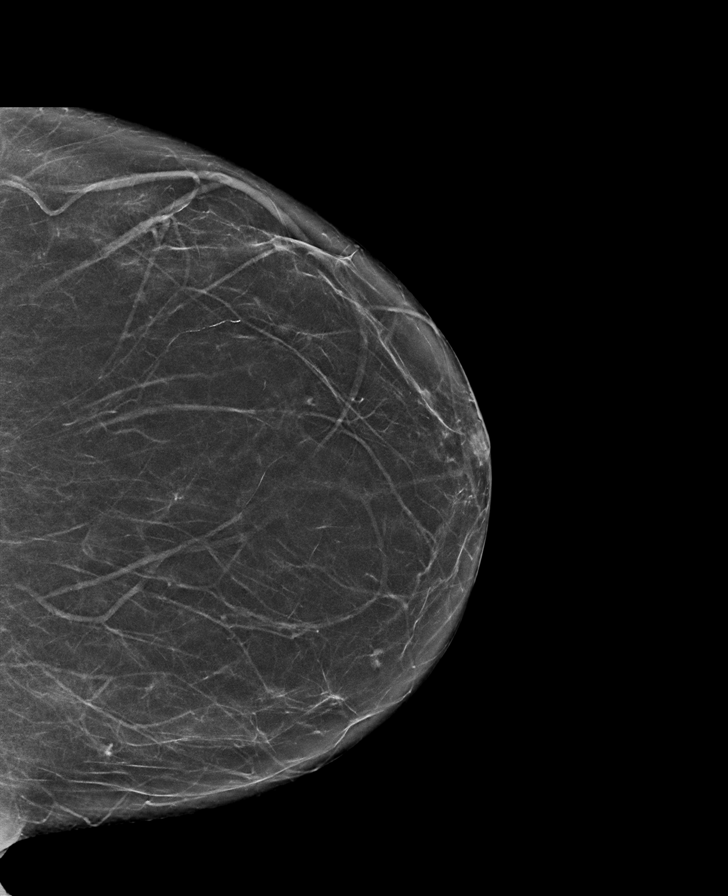

[R CC synth-2D]
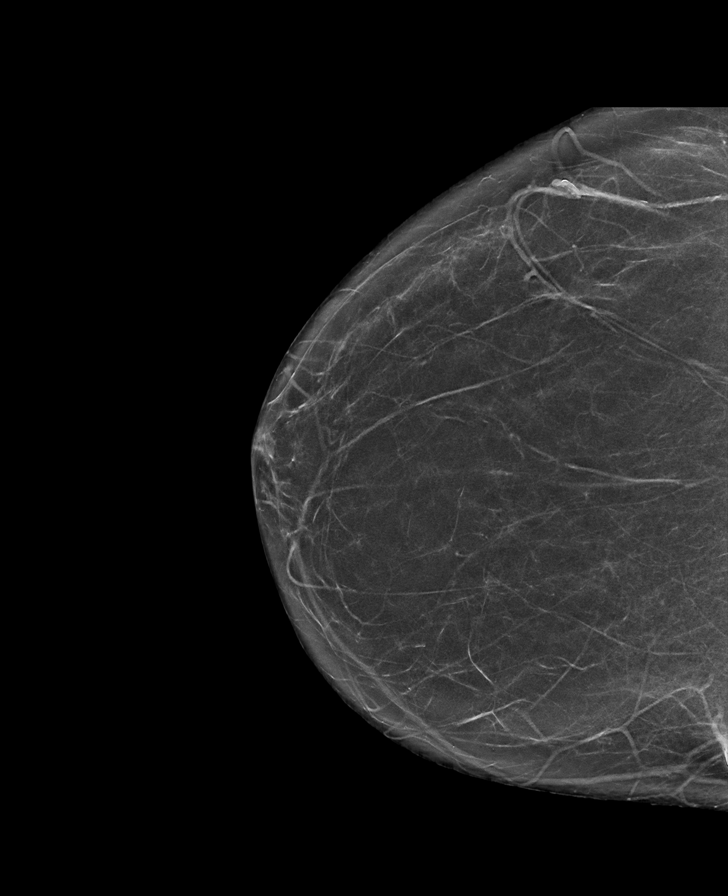

[L CC tomo · tomo slice 35/68.0]
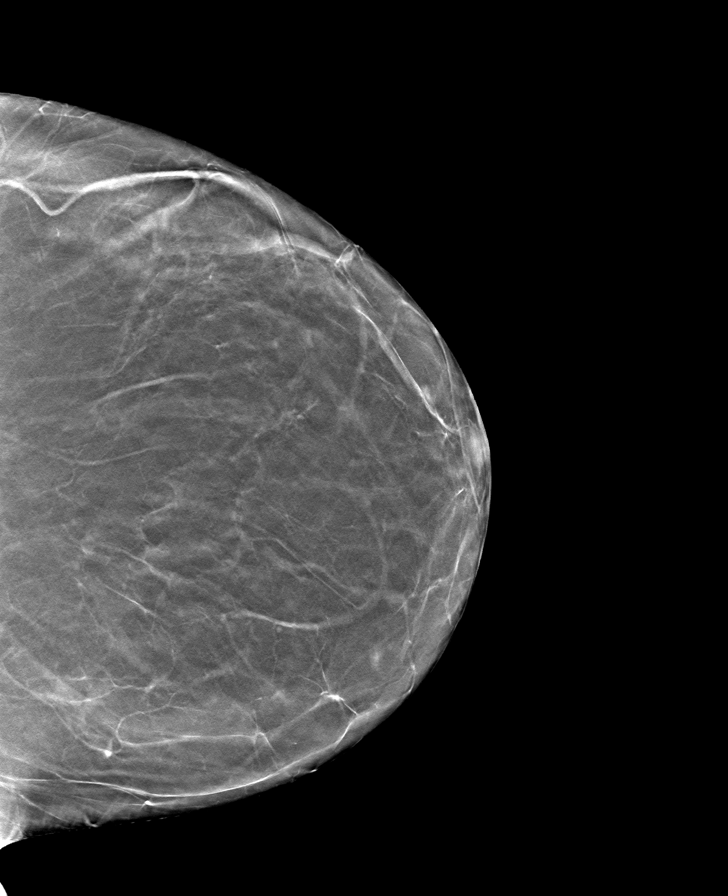

[R MLO tomo · tomo slice 41/80.0]
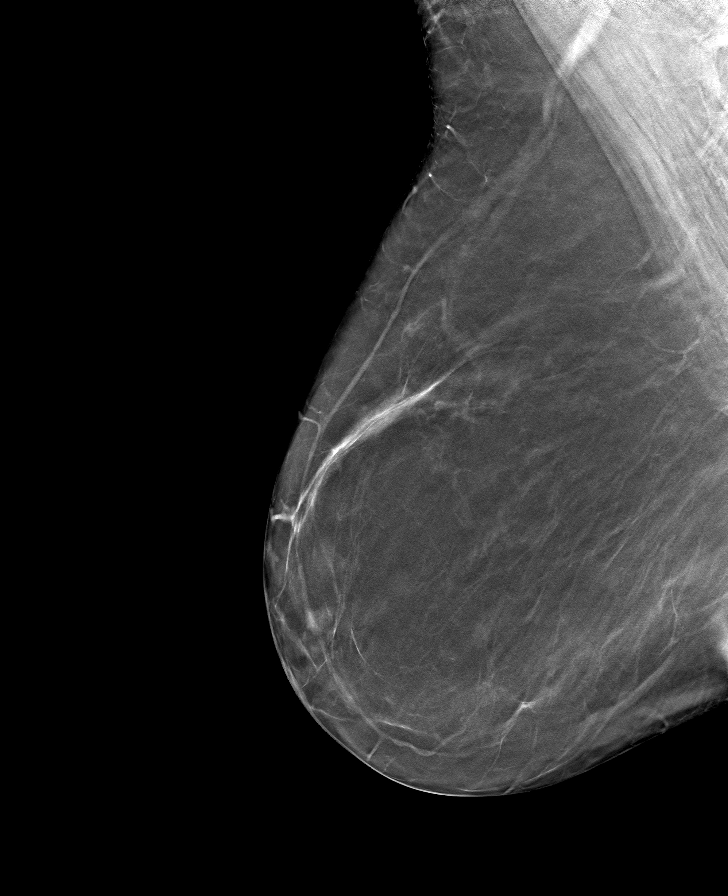

[R CC tomo · tomo slice 34/67.0]
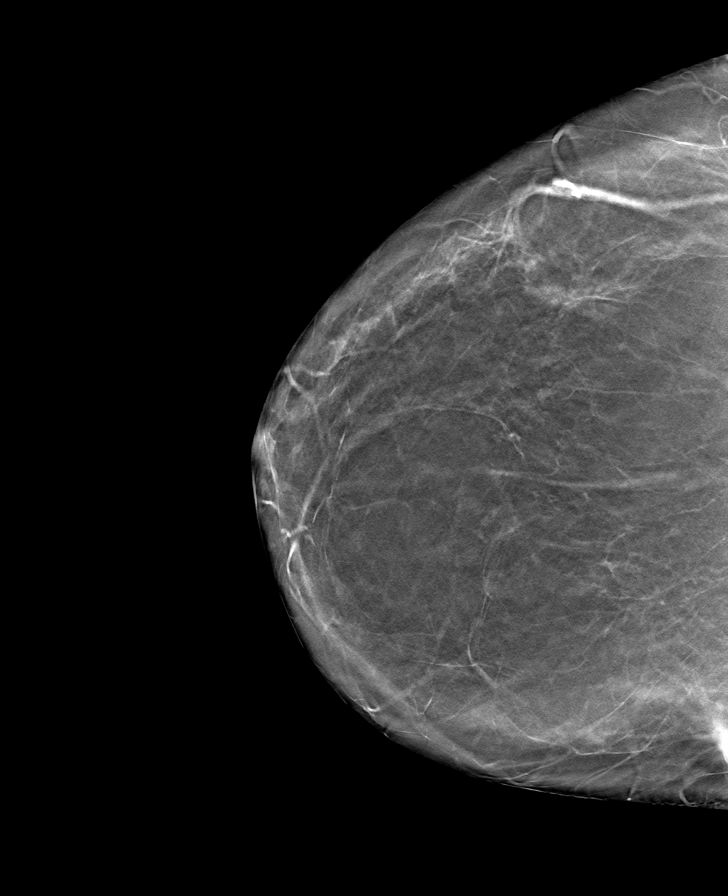

[L MLO tomo · tomo slice 43/84.0]
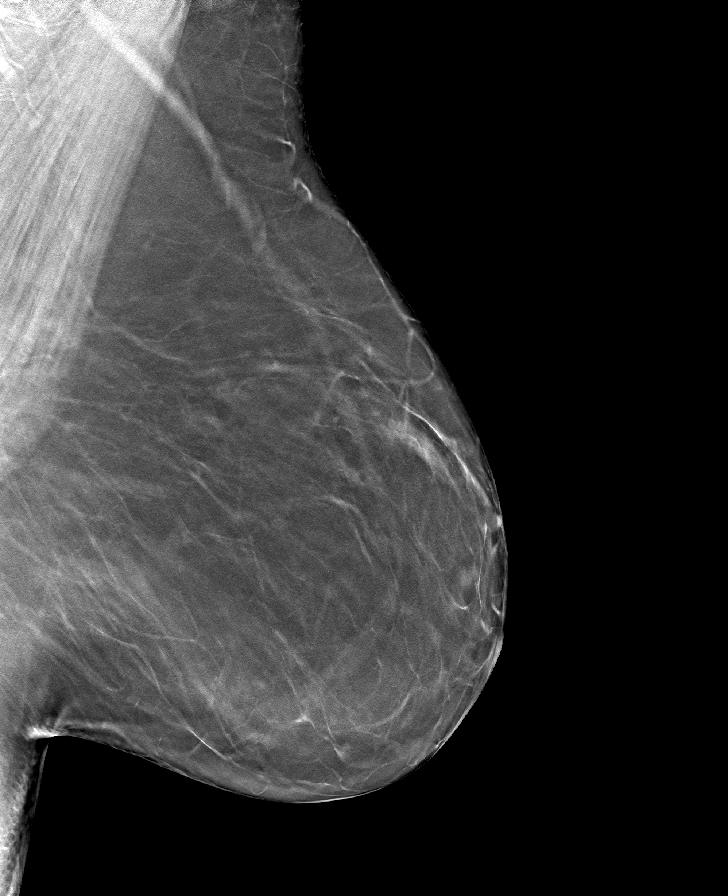

[8 of 24 positions shown; findings below may reference images not displayed]

FINDINGS: There are no findings suspicious for malignancy. Images were
processed with CAD.
IMPRESSION: No mammographic evidence of malignancy. A result letter of this
screening mammogram will be mailed directly to the patient.

RECOMMENDATION:
Screening mammogram in one year. (Code:[id number]-[id number])

BI-RADS CATEGORY  1: Negative.

## 2019-11-28 ENCOUNTER — Other Ambulatory Visit: Payer: Self-pay | Admitting: Family Medicine

## 2019-11-28 DIAGNOSIS — Z1231 Encounter for screening mammogram for malignant neoplasm of breast: Secondary | ICD-10-CM

## 2020-04-06 ENCOUNTER — Inpatient Hospital Stay: Admission: RE | Admit: 2020-04-06 | Payer: Commercial Managed Care - PPO | Source: Ambulatory Visit

## 2020-05-27 ENCOUNTER — Ambulatory Visit
Admission: RE | Admit: 2020-05-27 | Discharge: 2020-05-27 | Disposition: A | Discharge status: Home / Self Care | Payer: Commercial Managed Care - PPO | Source: Ambulatory Visit | Attending: Family Medicine | Admitting: Family Medicine

## 2020-05-27 ENCOUNTER — Other Ambulatory Visit: Payer: Self-pay

## 2020-05-27 ENCOUNTER — Other Ambulatory Visit: Payer: Self-pay | Admitting: Physician Assistant

## 2020-05-27 DIAGNOSIS — Z1231 Encounter for screening mammogram for malignant neoplasm of breast: Secondary | ICD-10-CM

## 2021-04-20 ENCOUNTER — Other Ambulatory Visit: Payer: Self-pay | Admitting: Physician Assistant

## 2021-04-20 DIAGNOSIS — Z1231 Encounter for screening mammogram for malignant neoplasm of breast: Secondary | ICD-10-CM

## 2021-05-28 ENCOUNTER — Ambulatory Visit: Payer: BC Managed Care – PPO

## 2021-06-18 ENCOUNTER — Ambulatory Visit
Admission: RE | Admit: 2021-06-18 | Discharge: 2021-06-18 | Disposition: A | Discharge status: Home / Self Care | Payer: BC Managed Care – PPO | Source: Ambulatory Visit | Attending: Physician Assistant | Admitting: Physician Assistant

## 2021-06-18 DIAGNOSIS — Z1231 Encounter for screening mammogram for malignant neoplasm of breast: Secondary | ICD-10-CM

## 2022-05-28 ENCOUNTER — Other Ambulatory Visit: Payer: Self-pay | Admitting: Physician Assistant

## 2022-05-28 DIAGNOSIS — Z1231 Encounter for screening mammogram for malignant neoplasm of breast: Secondary | ICD-10-CM

## 2022-06-29 ENCOUNTER — Ambulatory Visit
Admission: RE | Admit: 2022-06-29 | Discharge: 2022-06-29 | Disposition: A | Discharge status: Home / Self Care | Payer: BC Managed Care – PPO | Source: Ambulatory Visit

## 2022-06-29 DIAGNOSIS — Z1231 Encounter for screening mammogram for malignant neoplasm of breast: Secondary | ICD-10-CM

## 2023-06-27 ENCOUNTER — Other Ambulatory Visit: Payer: Self-pay | Admitting: Physician Assistant

## 2023-06-27 DIAGNOSIS — Z1231 Encounter for screening mammogram for malignant neoplasm of breast: Secondary | ICD-10-CM

## 2023-07-05 ENCOUNTER — Ambulatory Visit
Admission: RE | Admit: 2023-07-05 | Discharge: 2023-07-05 | Disposition: A | Discharge status: Home / Self Care | Source: Ambulatory Visit | Attending: Physician Assistant

## 2023-07-05 DIAGNOSIS — Z1231 Encounter for screening mammogram for malignant neoplasm of breast: Secondary | ICD-10-CM

## 2023-07-06 ENCOUNTER — Ambulatory Visit
